# Patient Record
Sex: Female | Born: 1985 | Race: White | Hispanic: No | Marital: Single | Smoking: Never smoker
Health system: Southern US, Community
[De-identification: ages and names within clinical notes are randomized; demographics above are authoritative.]

## PROBLEM LIST (undated history)

## (undated) DIAGNOSIS — T7840XA Allergy, unspecified, initial encounter: Secondary | ICD-10-CM

## (undated) DIAGNOSIS — J45909 Unspecified asthma, uncomplicated: Secondary | ICD-10-CM

## (undated) HISTORY — PX: KNEE SURGERY: SHX244

## (undated) HISTORY — DX: Unspecified asthma, uncomplicated: J45.909

## (undated) HISTORY — PX: TONSILLECTOMY AND ADENOIDECTOMY: SUR1326

## (undated) HISTORY — DX: Allergy, unspecified, initial encounter: T78.40XA

---

## 2012-11-22 ENCOUNTER — Other Ambulatory Visit (HOSPITAL_COMMUNITY)
Admission: RE | Admit: 2012-11-22 | Discharge: 2012-11-22 | Disposition: A | Discharge status: Home / Self Care | Payer: BC Managed Care – PPO | Source: Ambulatory Visit | Attending: Family Medicine | Admitting: Family Medicine

## 2012-11-22 DIAGNOSIS — Z124 Encounter for screening for malignant neoplasm of cervix: Secondary | ICD-10-CM | POA: Insufficient documentation

## 2014-01-22 ENCOUNTER — Other Ambulatory Visit: Payer: Self-pay | Admitting: Physician Assistant

## 2014-01-22 ENCOUNTER — Ambulatory Visit
Admission: RE | Admit: 2014-01-22 | Discharge: 2014-01-22 | Disposition: A | Discharge status: Home / Self Care | Payer: BC Managed Care – PPO | Source: Ambulatory Visit | Attending: Physician Assistant | Admitting: Physician Assistant

## 2014-01-22 DIAGNOSIS — R52 Pain, unspecified: Secondary | ICD-10-CM

## 2014-08-05 ENCOUNTER — Other Ambulatory Visit: Payer: Self-pay | Admitting: Physician Assistant

## 2014-08-05 ENCOUNTER — Ambulatory Visit
Admission: RE | Admit: 2014-08-05 | Discharge: 2014-08-05 | Disposition: A | Discharge status: Home / Self Care | Payer: BLUE CROSS/BLUE SHIELD | Source: Ambulatory Visit | Attending: Physician Assistant | Admitting: Physician Assistant

## 2014-08-05 DIAGNOSIS — M546 Pain in thoracic spine: Secondary | ICD-10-CM

## 2015-06-01 ENCOUNTER — Ambulatory Visit: Payer: BLUE CROSS/BLUE SHIELD | Admitting: Internal Medicine

## 2015-06-04 ENCOUNTER — Encounter: Payer: Self-pay | Admitting: Internal Medicine

## 2015-06-04 ENCOUNTER — Ambulatory Visit (INDEPENDENT_AMBULATORY_CARE_PROVIDER_SITE_OTHER): Payer: BLUE CROSS/BLUE SHIELD | Admitting: Internal Medicine

## 2015-06-04 VITALS — BP 110/70 | HR 105 | Temp 98.1°F | Resp 16 | Ht 65.0 in | Wt 241.0 lb

## 2015-06-04 DIAGNOSIS — J4520 Mild intermittent asthma, uncomplicated: Secondary | ICD-10-CM

## 2015-06-04 DIAGNOSIS — J302 Other seasonal allergic rhinitis: Secondary | ICD-10-CM | POA: Diagnosis not present

## 2015-06-04 MED ORDER — MONTELUKAST SODIUM 10 MG PO TABS: 10.0000 mg | ORAL_TABLET | Freq: Every day | ORAL | Status: DC

## 2015-06-04 NOTE — Patient Instructions (Signed)
We have sent in the singulair (montelukast) which is good for the sinuses especially in allergic asthma sufferers. 1 pill a day during your worst seasons.   If you need any refills or problems please feel free to call us.  If you are doing well come back in about 1 year for a physical.   Health Maintenance, Female Adopting a healthy lifestyle and getting preventive care can go a long way to promote health and wellness. Talk with your health care provider about what schedule of regular examinations is right for you. This is a good chance for you to check in with your provider about disease prevention and staying healthy. In between checkups, there are plenty of things you can do on your own. Experts have done a lot of research about which lifestyle changes and preventive measures are most likely to keep you healthy. Ask your health care provider for more information. WEIGHT AND DIET  Eat a healthy diet  Be sure to include plenty of vegetables, fruits, low-fat dairy products, and lean protein.  Do not eat a lot of foods high in solid fats, added sugars, or salt.  Get regular exercise. This is one of the most important things you can do for your health.  Most adults should exercise for at least 150 minutes each week. The exercise should increase your heart rate and make you sweat (moderate-intensity exercise).  Most adults should also do strengthening exercises at least twice a week. This is in addition to the moderate-intensity exercise.  Maintain a healthy weight  Body mass index (BMI) is a measurement that can be used to identify possible weight problems. It estimates body fat based on height and weight. Your health care provider can help determine your BMI and help you achieve or maintain a healthy weight.  For females 66 years of age and older:   A BMI below 18.5 is considered underweight.  A BMI of 18.5 to 24.9 is normal.  A BMI of 25 to 29.9 is considered overweight.  A BMI of  30 and above is considered obese.  Watch levels of cholesterol and blood lipids  You should start having your blood tested for lipids and cholesterol at 30 years of age, then have this test every 5 years.  You may need to have your cholesterol levels checked more often if:  Your lipid or cholesterol levels are high.  You are older than 30 years of age.  You are at high risk for heart disease.  CANCER SCREENING   Lung Cancer  Lung cancer screening is recommended for adults 28-43 years old who are at high risk for lung cancer because of a history of smoking.  A yearly low-dose CT scan of the lungs is recommended for people who:  Currently smoke.  Have quit within the past 15 years.  Have at least a 30-pack-year history of smoking. A pack year is smoking an average of one pack of cigarettes a day for 1 year.  Yearly screening should continue until it has been 15 years since you quit.  Yearly screening should stop if you develop a health problem that would prevent you from having lung cancer treatment.  Breast Cancer  Practice breast self-awareness. This means understanding how your breasts normally appear and feel.  It also means doing regular breast self-exams. Let your health care provider know about any changes, no matter how small.  If you are in your 20s or 30s, you should have a clinical breast exam (CBE)  by a health care provider every 1-3 years as part of a regular health exam.  If you are 40 or older, have a CBE every year. Also consider having a breast X-ray (mammogram) every year.  If you have a family history of breast cancer, talk to your health care provider about genetic screening.  If you are at high risk for breast cancer, talk to your health care provider about having an MRI and a mammogram every year.  Breast cancer gene (BRCA) assessment is recommended for women who have family members with BRCA-related cancers. BRCA-related cancers  include:  Breast.  Ovarian.  Tubal.  Peritoneal cancers.  Results of the assessment will determine the need for genetic counseling and BRCA1 and BRCA2 testing. Cervical Cancer Your health care provider may recommend that you be screened regularly for cancer of the pelvic organs (ovaries, uterus, and vagina). This screening involves a pelvic examination, including checking for microscopic changes to the surface of your cervix (Pap test). You may be encouraged to have this screening done every 3 years, beginning at age 21.  For women ages 30-65, health care providers may recommend pelvic exams and Pap testing every 3 years, or they may recommend the Pap and pelvic exam, combined with testing for human papilloma virus (HPV), every 5 years. Some types of HPV increase your risk of cervical cancer. Testing for HPV may also be done on women of any age with unclear Pap test results.  Other health care providers may not recommend any screening for nonpregnant women who are considered low risk for pelvic cancer and who do not have symptoms. Ask your health care provider if a screening pelvic exam is right for you.  If you have had past treatment for cervical cancer or a condition that could lead to cancer, you need Pap tests and screening for cancer for at least 20 years after your treatment. If Pap tests have been discontinued, your risk factors (such as having a new sexual partner) need to be reassessed to determine if screening should resume. Some women have medical problems that increase the chance of getting cervical cancer. In these cases, your health care provider may recommend more frequent screening and Pap tests. Colorectal Cancer  This type of cancer can be detected and often prevented.  Routine colorectal cancer screening usually begins at 30 years of age and continues through 30 years of age.  Your health care provider may recommend screening at an earlier age if you have risk factors for  colon cancer.  Your health care provider may also recommend using home test kits to check for hidden blood in the stool.  A small camera at the end of a tube can be used to examine your colon directly (sigmoidoscopy or colonoscopy). This is done to check for the earliest forms of colorectal cancer.  Routine screening usually begins at age 50.  Direct examination of the colon should be repeated every 5-10 years through 30 years of age. However, you may need to be screened more often if early forms of precancerous polyps or small growths are found. Skin Cancer  Check your skin from head to toe regularly.  Tell your health care provider about any new moles or changes in moles, especially if there is a change in a mole's shape or color.  Also tell your health care provider if you have a mole that is larger than the size of a pencil eraser.  Always use sunscreen. Apply sunscreen liberally and repeatedly throughout the   day.  Protect yourself by wearing long sleeves, pants, a wide-brimmed hat, and sunglasses whenever you are outside. HEART DISEASE, DIABETES, AND HIGH BLOOD PRESSURE   High blood pressure causes heart disease and increases the risk of stroke. High blood pressure is more likely to develop in:  People who have blood pressure in the high end of the normal range (130-139/85-89 mm Hg).  People who are overweight or obese.  People who are African American.  If you are 18-39 years of age, have your blood pressure checked every 3-5 years. If you are 40 years of age or older, have your blood pressure checked every year. You should have your blood pressure measured twice--once when you are at a hospital or clinic, and once when you are not at a hospital or clinic. Record the average of the two measurements. To check your blood pressure when you are not at a hospital or clinic, you can use:  An automated blood pressure machine at a pharmacy.  A home blood pressure monitor.  If you  are between 55 years and 79 years old, ask your health care provider if you should take aspirin to prevent strokes.  Have regular diabetes screenings. This involves taking a blood sample to check your fasting blood sugar level.  If you are at a normal weight and have a low risk for diabetes, have this test once every three years after 30 years of age.  If you are overweight and have a high risk for diabetes, consider being tested at a younger age or more often. PREVENTING INFECTION  Hepatitis B  If you have a higher risk for hepatitis B, you should be screened for this virus. You are considered at high risk for hepatitis B if:  You were born in a country where hepatitis B is common. Ask your health care provider which countries are considered high risk.  Your parents were born in a high-risk country, and you have not been immunized against hepatitis B (hepatitis B vaccine).  You have HIV or AIDS.  You use needles to inject street drugs.  You live with someone who has hepatitis B.  You have had sex with someone who has hepatitis B.  You get hemodialysis treatment.  You take certain medicines for conditions, including cancer, organ transplantation, and autoimmune conditions. Hepatitis C  Blood testing is recommended for:  Everyone born from 1945 through 1965.  Anyone with known risk factors for hepatitis C. Sexually transmitted infections (STIs)  You should be screened for sexually transmitted infections (STIs) including gonorrhea and chlamydia if:  You are sexually active and are younger than 30 years of age.  You are older than 30 years of age and your health care provider tells you that you are at risk for this type of infection.  Your sexual activity has changed since you were last screened and you are at an increased risk for chlamydia or gonorrhea. Ask your health care provider if you are at risk.  If you do not have HIV, but are at risk, it may be recommended that you  take a prescription medicine daily to prevent HIV infection. This is called pre-exposure prophylaxis (PrEP). You are considered at risk if:  You are sexually active and do not regularly use condoms or know the HIV status of your partner(s).  You take drugs by injection.  You are sexually active with a partner who has HIV. Talk with your health care provider about whether you are at high risk of   being infected with HIV. If you choose to begin PrEP, you should first be tested for HIV. You should then be tested every 3 months for as long as you are taking PrEP.  PREGNANCY   If you are premenopausal and you may become pregnant, ask your health care provider about preconception counseling.  If you may become pregnant, take 400 to 800 micrograms (mcg) of folic acid every day.  If you want to prevent pregnancy, talk to your health care provider about birth control (contraception). OSTEOPOROSIS AND MENOPAUSE   Osteoporosis is a disease in which the bones lose minerals and strength with aging. This can result in serious bone fractures. Your risk for osteoporosis can be identified using a bone density scan.  If you are 65 years of age or older, or if you are at risk for osteoporosis and fractures, ask your health care provider if you should be screened.  Ask your health care provider whether you should take a calcium or vitamin D supplement to lower your risk for osteoporosis.  Menopause may have certain physical symptoms and risks.  Hormone replacement therapy may reduce some of these symptoms and risks. Talk to your health care provider about whether hormone replacement therapy is right for you.  HOME CARE INSTRUCTIONS   Schedule regular health, dental, and eye exams.  Stay current with your immunizations.   Do not use any tobacco products including cigarettes, chewing tobacco, or electronic cigarettes.  If you are pregnant, do not drink alcohol.  If you are breastfeeding, limit how  much and how often you drink alcohol.  Limit alcohol intake to no more than 1 drink per day for nonpregnant women. One drink equals 12 ounces of beer, 5 ounces of wine, or 1 ounces of hard liquor.  Do not use street drugs.  Do not share needles.  Ask your health care provider for help if you need support or information about quitting drugs.  Tell your health care provider if you often feel depressed.  Tell your health care provider if you have ever been abused or do not feel safe at home.   This information is not intended to replace advice given to you by your health care provider. Make sure you discuss any questions you have with your health care provider.   Document Released: 11/15/2010 Document Revised: 05/23/2014 Document Reviewed: 04/03/2013 Elsevier Interactive Patient Education 2016 [employer]  

## 2015-06-05 ENCOUNTER — Encounter: Payer: Self-pay | Admitting: Internal Medicine

## 2015-06-05 DIAGNOSIS — J45909 Unspecified asthma, uncomplicated: Secondary | ICD-10-CM | POA: Insufficient documentation

## 2015-06-05 DIAGNOSIS — J309 Allergic rhinitis, unspecified: Secondary | ICD-10-CM | POA: Insufficient documentation

## 2015-06-05 NOTE — Assessment & Plan Note (Signed)
She is using flonase daily and will stop the zyrtec and add singulair. Talked to her about sinus rinses as well since she does not like being on medicines.

## 2015-06-05 NOTE — Assessment & Plan Note (Signed)
She has albuterol prn and will add singulair as her allergies mostly trigger her asthma. Does not need step therapy today and no flare. Mild intermittent.

## 2015-06-05 NOTE — Progress Notes (Signed)
   Subjective:    Patient ID: Carrie Ward, female    DOB: 1986/03/13, 30 y.o.   MRN: [id number]  HPI The patient is a new 30 YO female coming in for her asthma. She does have worsening symptoms in the season changes. Usually when she gets a cold this sets off a flare. Denies flare now but using flonase and zyrtec to avoid. She uses albuterol inhaler or nebulizer in flare. Has rarely needed inhaled corticosteroids to avoid prednisone. Last prednisone was about 1 year ago. Denies fevers or chills. Mild cough with some allergy drainage. Does not feel that she is well controlled with that right now.  PMH, [facility], social history reviewed and updated.   Review of Systems  Constitutional: Negative for fever, activity change, appetite change, fatigue and unexpected weight change.  HENT: Positive for congestion, postnasal drip and rhinorrhea. Negative for ear discharge, ear pain, sinus pressure, sore throat and trouble swallowing.   Eyes: Negative.   Respiratory: Negative for cough, chest tightness and shortness of breath.   Cardiovascular: Negative for chest pain, palpitations and leg swelling.  Gastrointestinal: Negative for nausea, abdominal pain, diarrhea, constipation and abdominal distention.  Musculoskeletal: Negative.   Skin: Negative.   Neurological: Negative.   Psychiatric/Behavioral: Negative.       Objective:   Physical Exam  Constitutional: She is oriented to person, place, and time. She appears well-developed and well-nourished.  HENT:  Head: Normocephalic and atraumatic.  Oropharynx red with some clear drainage  Eyes: EOM are normal.  Neck: Normal range of motion.  Cardiovascular: Normal rate and regular rhythm.   No murmur heard. Pulmonary/Chest: Effort normal and breath sounds normal. No respiratory distress. She has no wheezes. She has no rales.  Abdominal: Soft. Bowel sounds are normal. She exhibits no distension. There is no tenderness. There is no rebound.    Musculoskeletal: She exhibits no edema.  Neurological: She is alert and oriented to person, place, and time. Coordination normal.  Skin: Skin is warm and dry.  Psychiatric: She has a normal mood and affect.   Filed Vitals:   06/04/15 1532  BP: 110/70  Pulse: 105  Temp: 98.1 F (36.7 C)  TempSrc: Oral  Resp: 16  Height:  (1.651 m)  Weight: 241 lb (109.317 kg)  SpO2: 98%      Assessment & Plan:

## 2015-06-05 NOTE — Assessment & Plan Note (Signed)
She does exercise 3-4 times per week. Meeting with nutritionist to work on eating habits. Per her reports recent labs normal for sugar and cholesterol and will get those records.

## 2016-01-29 ENCOUNTER — Ambulatory Visit (INDEPENDENT_AMBULATORY_CARE_PROVIDER_SITE_OTHER): Payer: BLUE CROSS/BLUE SHIELD | Admitting: Family

## 2016-01-29 ENCOUNTER — Encounter: Payer: Self-pay | Admitting: Family

## 2016-01-29 ENCOUNTER — Telehealth: Payer: Self-pay | Admitting: Internal Medicine

## 2016-01-29 DIAGNOSIS — J019 Acute sinusitis, unspecified: Secondary | ICD-10-CM | POA: Insufficient documentation

## 2016-01-29 DIAGNOSIS — J0140 Acute pansinusitis, unspecified: Secondary | ICD-10-CM | POA: Diagnosis not present

## 2016-01-29 MED ORDER — ALBUTEROL SULFATE (5 MG/ML) 0.5% IN NEBU: 2.5000 mg | INHALATION_SOLUTION | Freq: Four times a day (QID) | RESPIRATORY_TRACT | 1 refills | Status: DC | PRN

## 2016-01-29 MED ORDER — FLUTICASONE FUROATE-VILANTEROL 100-25 MCG/INH IN AEPB
1.0000 | INHALATION_SPRAY | Freq: Every day | RESPIRATORY_TRACT | 0 refills | Status: AC
Start: 2016-01-29 — End: ?

## 2016-01-29 MED ORDER — ALBUTEROL SULFATE HFA 108 (90 BASE) MCG/ACT IN AERS: 1.0000 | INHALATION_SPRAY | RESPIRATORY_TRACT | 3 refills | Status: AC | PRN

## 2016-01-29 MED ORDER — ALBUTEROL SULFATE (2.5 MG/3ML) 0.083% IN NEBU: 2.5000 mg | INHALATION_SOLUTION | Freq: Four times a day (QID) | RESPIRATORY_TRACT | 1 refills | Status: AC | PRN

## 2016-01-29 MED ORDER — HYDROCOD POLST-CPM POLST ER 10-8 MG/5ML PO SUER: 5.0000 mL | Freq: Every evening | ORAL | 0 refills | Status: DC | PRN

## 2016-01-29 MED ORDER — AMOXICILLIN-POT CLAVULANATE 875-125 MG PO TABS: 1.0000 | ORAL_TABLET | Freq: Two times a day (BID) | ORAL | 0 refills | Status: DC

## 2016-01-29 NOTE — Telephone Encounter (Signed)
Pt called in and said that the form that she got the albuterol (PROVENTIL) (5 MG/ML) 0.5% nebulizer solution [[id number] today was not normally how she gets it.  She normally just gets it in the tubes not a dropper.  Can this be changed and faxed to [facility] [address] .

## 2016-01-29 NOTE — Progress Notes (Signed)
Subjective:    Patient ID: Carrie Ward, female    DOB: 1986/01/06, 30 y.o.   MRN: [id number]  Chief Complaint  Patient presents with  . Cough    x2 weeks, started with runny nose, labor day weekend lost voice, congestion in chest, pressure in ears and head, has had to use nebulizer at home more often    HPI:  Carrie Ward is a 30 y.o. female who  has a past medical history of Allergy and Asthma. and presents today for an acute office visit.   This is a new problem. Associated symptom of runny nose, chest congestion, pressure in ears and head have been going on for about 2 weeks. Has had to use the nebulizer at home more often. No fevers that she can recall. Modifying factors include Mucinex and Sudafed. Course of the symptoms has had some improvement and then worsening. No recent antibiotic use. Generally does not have to use her inhalers.   No Known Allergies    Outpatient Medications Prior to Visit  Medication Sig Dispense Refill  . fluticasone (FLONASE) 50 MCG/ACT nasal spray Place 2 sprays into both nostrils daily.    Marland Kitchen. albuterol (PROVENTIL) (5 MG/ML) 0.5% nebulizer solution Take 2.5 mg by nebulization every 6 (six) hours as needed for wheezing or shortness of breath.    Marland Kitchen. albuterol (VENTOLIN HFA) 108 (90 Base) MCG/ACT inhaler Inhale 1 puff into the lungs as needed for wheezing or shortness of breath.    . cetirizine (ZYRTEC) 10 MG tablet Take 10 mg by mouth daily.    . montelukast (SINGULAIR) 10 MG tablet Take 1 tablet (10 mg total) by mouth at bedtime. 90 tablet 3   No facility-administered medications prior to visit.     Review of Systems  Constitutional: Negative for chills and fever.  HENT: Positive for congestion and sinus pressure. Negative for sore throat.   Respiratory: Positive for cough, shortness of breath and wheezing. Negative for chest tightness.   Neurological: Positive for headaches.      Objective:    BP 118/72 (BP Location: Left Arm,  Patient Position: Sitting, Cuff Size: Large)   Pulse 88   Temp 98.3 F (36.8 C) (Oral)   Resp 16   Ht 5\' 5"  (1.651 m)   Wt 250 lb (113.4 kg)   SpO2 98%   BMI 41.60 kg/m  Nursing note and vital signs reviewed.  Physical Exam  Constitutional: She is oriented to person, place, and time. She appears well-developed and well-nourished. No distress.  HENT:  Right Ear: Hearing, tympanic membrane, external ear and ear canal normal.  Left Ear: Hearing, tympanic membrane, external ear and ear canal normal.  Nose: Right sinus exhibits maxillary sinus tenderness and frontal sinus tenderness. Left sinus exhibits maxillary sinus tenderness and frontal sinus tenderness.  Mouth/Throat: Uvula is midline, oropharynx is clear and moist and mucous membranes are normal.  Neck: Neck supple.  Cardiovascular: Normal rate, regular rhythm, normal heart sounds and intact distal pulses.   Pulmonary/Chest: Effort normal and breath sounds normal.  Neurological: She is alert and oriented to person, place, and time.  Skin: Skin is warm and dry.  Psychiatric: She has a normal mood and affect. Her behavior is normal. Judgment and thought content normal.       Assessment & Plan:   Problem List Items Addressed This Visit      Respiratory   Sinusitis, acute    Symptoms and exam consistent with bacterial sinusitis. Start Augmentin. Start Tussionex as  needed for cough and sleep. Sample of Breo provided for wheezing. Continue over-the-counter medications as needed for symptom relief and supportive care. Follow-up if symptoms worsen or do not improve.      Relevant Medications   amoxicillin-clavulanate (AUGMENTIN) 875-125 MG tablet   chlorpheniramine-HYDROcodone (TUSSIONEX PENNKINETIC ER) 10-8 MG/5ML SUER    Other Visit Diagnoses   None.      I have discontinued Ms. Gnau's cetirizine and montelukast. I am also having her start on amoxicillin-clavulanate, chlorpheniramine-HYDROcodone, and fluticasone  furoate-vilanterol. Additionally, I am having her maintain her fluticasone and albuterol.   Meds ordered this encounter  Medications  . DISCONTD: albuterol (PROVENTIL) (5 MG/ML) 0.5% nebulizer solution    Sig: Take 0.5 mLs (2.5 mg total) by nebulization every 6 (six) hours as needed for wheezing or shortness of breath.    Dispense:  20 mL    Refill:  1    Order Specific Question:   Supervising Provider    Answer:   Hillard Danker A [[id number]]  . albuterol (VENTOLIN HFA) 108 (90 Base) MCG/ACT inhaler    Sig: Inhale 1 puff into the lungs as needed for wheezing or shortness of breath.    Dispense:  18 g    Refill:  3    Order Specific Question:   Supervising Provider    Answer:   Hillard Danker A [[id number]]  . amoxicillin-clavulanate (AUGMENTIN) 875-125 MG tablet    Sig: Take 1 tablet by mouth 2 (two) times daily.    Dispense:  20 tablet    Refill:  0    Order Specific Question:   Supervising Provider    Answer:   Hillard Danker A [[id number]]  . chlorpheniramine-HYDROcodone (TUSSIONEX PENNKINETIC ER) 10-8 MG/5ML SUER    Sig: Take 5 mLs by mouth at bedtime as needed.    Dispense:  115 mL    Refill:  0    Order Specific Question:   Supervising Provider    Answer:   Hillard Danker A [[id number]]  . fluticasone furoate-vilanterol (BREO ELLIPTA) 100-25 MCG/INH AEPB    Sig: Inhale 1 puff into the lungs daily.    Dispense:  28 each    Refill:  0    Order Specific Question:   Supervising Provider    Answer:   Hillard Danker A [[id number]]     Follow-up: Return if symptoms worsen or fail to improve.  Jeanine Luz, FNP

## 2016-01-29 NOTE — Patient Instructions (Signed)
Thank you for choosing [facility].  SUMMARY AND INSTRUCTIONS:  Medication:  Your prescription(s) have been submitted to your pharmacy or been printed and provided for you. Please take as directed and contact our office if you believe you are having problem(s) with the medication(s) or have any questions.   Follow up:  If your symptoms worsen or fail to improve, please contact our office for further instruction, or in case of emergency go directly to the emergency room at the closest medical facility.    General Recommendations:    Please drink plenty of fluids.  Get plenty of rest   Sleep in humidified air  Use saline nasal sprays  Netti pot   OTC Medications:  Decongestants - helps relieve congestion   Flonase (generic fluticasone) or Nasacort (generic triamcinolone) - please make sure to use the "cross-over" technique at a 45 degree angle towards the opposite eye as opposed to straight up the nasal passageway.   Sudafed (generic pseudoephedrine - Note this is the one that is available behind the pharmacy counter); Products with phenylephrine (-PE) may also be used but is often not as effective as pseudoephedrine.   If you have HIGH BLOOD PRESSURE - Coricidin HBP; AVOID any product that is -D as this contains pseudoephedrine which may increase your blood pressure.  Afrin (oxymetazoline) every 6-8 hours for up to 3 days.   Allergies - helps relieve runny nose, itchy eyes and sneezing   Claritin (generic loratidine), Allegra (fexofenidine), or Zyrtec (generic cyrterizine) for runny nose. These medications should not cause drowsiness.  Note - Benadryl (generic diphenhydramine) may be used however may cause drowsiness  Cough -   Delsym or Robitussin (generic dextromethorphan)  Expectorants - helps loosen mucus to ease removal   Mucinex (generic guaifenesin) as directed on the package.  Headaches / General Aches   Tylenol (generic acetaminophen) - DO  NOT EXCEED 3 grams (3,000 mg) in a 24 hour time period  Advil/Motrin (generic ibuprofen)   Sore Throat -   Salt water gargle   Chloraseptic (generic benzocaine) spray or lozenges / Sucrets (generic dyclonine)    Sinusitis Sinusitis is redness, soreness, and inflammation of the paranasal sinuses. Paranasal sinuses are air pockets within the bones of your face (beneath the eyes, the middle of the forehead, or above the eyes). In healthy paranasal sinuses, mucus is able to drain out, and air is able to circulate through them by way of your nose. However, when your paranasal sinuses are inflamed, mucus and air can become trapped. This can allow bacteria and other germs to grow and cause infection. Sinusitis can develop quickly and last only a short time (acute) or continue over a long period (chronic). Sinusitis that lasts for more than 12 weeks is considered chronic.  CAUSES  Causes of sinusitis include:  Allergies.  Structural abnormalities, such as displacement of the cartilage that separates your nostrils (deviated septum), which can decrease the air flow through your nose and sinuses and affect sinus drainage.  Functional abnormalities, such as when the small hairs (cilia) that line your sinuses and help remove mucus do not work properly or are not present. SIGNS AND SYMPTOMS  Symptoms of acute and chronic sinusitis are the same. The primary symptoms are pain and pressure around the affected sinuses. Other symptoms include:  Upper toothache.  Earache.  Headache.  Bad breath.  Decreased sense of smell and taste.  A cough, which worsens when you are lying flat.  Fatigue.  Fever.  Thick drainage   from your nose, which often is green and may contain pus (purulent).  Swelling and warmth over the affected sinuses. DIAGNOSIS  Your health care provider will perform a physical exam. During the exam, your health care provider may:  Look in your nose for signs of abnormal growths  in your nostrils (nasal polyps).  Tap over the affected sinus to check for signs of infection.  View the inside of your sinuses (endoscopy) using an imaging device that has a light attached (endoscope). If your health care provider suspects that you have chronic sinusitis, one or more of the following tests may be recommended:  Allergy tests.  Nasal culture. A sample of mucus is taken from your nose, sent to a lab, and screened for bacteria.  Nasal cytology. A sample of mucus is taken from your nose and examined by your health care provider to determine if your sinusitis is related to an allergy. TREATMENT  Most cases of acute sinusitis are related to a viral infection and will resolve on their own within 10 days. Sometimes medicines are prescribed to help relieve symptoms (pain medicine, decongestants, nasal steroid sprays, or saline sprays).  However, for sinusitis related to a bacterial infection, your health care provider will prescribe antibiotic medicines. These are medicines that will help kill the bacteria causing the infection.  Rarely, sinusitis is caused by a fungal infection. In theses cases, your health care provider will prescribe antifungal medicine. For some cases of chronic sinusitis, surgery is needed. Generally, these are cases in which sinusitis recurs more than 3 times per year, despite other treatments. HOME CARE INSTRUCTIONS   Drink plenty of water. Water helps thin the mucus so your sinuses can drain more easily.  Use a humidifier.  Inhale steam 3 to 4 times a day (for example, sit in the bathroom with the shower running).  Apply a warm, moist washcloth to your face 3 to 4 times a day, or as directed by your health care provider.  Use saline nasal sprays to help moisten and clean your sinuses.  Take medicines only as directed by your health care provider.  If you were prescribed either an antibiotic or antifungal medicine, finish it all even if you start to feel  better. SEEK IMMEDIATE MEDICAL CARE IF:  You have increasing pain or severe headaches.  You have nausea, vomiting, or drowsiness.  You have swelling around your face.  You have vision problems.  You have a stiff neck.  You have difficulty breathing. MAKE SURE YOU:   Understand these instructions.  Will watch your condition.  Will get help right away if you are not doing well or get worse. Document Released: 05/02/2005 Document Revised: 09/16/2013 Document Reviewed: 05/17/2011 Exit[employer] Patient Information 2015 [employer]. This information is not intended to replace advice given to you by your health care provider. Make sure you discuss any questions you have with your health care provider.   

## 2016-01-29 NOTE — Assessment & Plan Note (Signed)
Symptoms and exam consistent with bacterial sinusitis. Start Augmentin. Start Tussionex as needed for cough and sleep. Sample of Breo provided for wheezing. Continue over-the-counter medications as needed for symptom relief and supportive care. Follow-up if symptoms worsen or do not improve.

## 2016-01-29 NOTE — Telephone Encounter (Signed)
Resent

## 2016-10-17 ENCOUNTER — Ambulatory Visit (INDEPENDENT_AMBULATORY_CARE_PROVIDER_SITE_OTHER): Payer: BLUE CROSS/BLUE SHIELD | Admitting: Internal Medicine

## 2016-10-17 ENCOUNTER — Encounter: Payer: Self-pay | Admitting: Internal Medicine

## 2016-10-17 DIAGNOSIS — H66001 Acute suppurative otitis media without spontaneous rupture of ear drum, right ear: Secondary | ICD-10-CM | POA: Diagnosis not present

## 2016-10-17 DIAGNOSIS — J069 Acute upper respiratory infection, unspecified: Secondary | ICD-10-CM | POA: Insufficient documentation

## 2016-10-17 DIAGNOSIS — J040 Acute laryngitis: Secondary | ICD-10-CM

## 2016-10-17 DIAGNOSIS — H6690 Otitis media, unspecified, unspecified ear: Secondary | ICD-10-CM | POA: Insufficient documentation

## 2016-10-17 MED ORDER — AMOXICILLIN-POT CLAVULANATE 875-125 MG PO TABS: 1.0000 | ORAL_TABLET | Freq: Two times a day (BID) | ORAL | 0 refills | Status: AC

## 2016-10-17 NOTE — Progress Notes (Signed)
Subjective:  Patient ID: Carrie Ward, female    DOB: 08-Jun-1985  Age: 31 y.o. MRN: [id number]  CC: No chief complaint on file.   HPI Carrie Ward presents for URI sx's x 1 week - worse. C/o voice loss. Asthma is ok. Coughing up green. C/o bad earache  Outpatient Medications Prior to Visit  Medication Sig Dispense Refill  . albuterol (PROVENTIL) (2.5 MG/3ML) 0.083% nebulizer solution Take 3 mLs (2.5 mg total) by nebulization every 6 (six) hours as needed for wheezing or shortness of breath. 150 mL 1  . albuterol (VENTOLIN HFA) 108 (90 Base) MCG/ACT inhaler Inhale 1 puff into the lungs as needed for wheezing or shortness of breath. 18 g 3  . fluticasone (FLONASE) 50 MCG/ACT nasal spray Place 2 sprays into both nostrils daily.    . fluticasone furoate-vilanterol (BREO ELLIPTA) 100-25 MCG/INH AEPB Inhale 1 puff into the lungs daily. 28 each 0  . amoxicillin-clavulanate (AUGMENTIN) 875-125 MG tablet Take 1 tablet by mouth 2 (two) times daily. 20 tablet 0  . chlorpheniramine-HYDROcodone (TUSSIONEX PENNKINETIC ER) 10-8 MG/5ML SUER Take 5 mLs by mouth at bedtime as needed. 115 mL 0   No facility-administered medications prior to visit.     ROS Review of Systems  Constitutional: Positive for fatigue. Negative for activity change, appetite change, chills and unexpected weight change.  HENT: Positive for congestion, ear pain, rhinorrhea, sore throat and voice change. Negative for mouth sores and sinus pressure.   Eyes: Negative for visual disturbance.  Respiratory: Negative for cough and chest tightness.   Gastrointestinal: Negative for abdominal pain and nausea.  Genitourinary: Negative for difficulty urinating, frequency and vaginal pain.  Musculoskeletal: Negative for back pain and gait problem.  Skin: Negative for pallor and rash.  Neurological: Negative for dizziness, tremors, weakness, numbness and headaches.  Psychiatric/Behavioral: Negative for confusion and sleep  disturbance.    Objective:  BP 126/88 (BP Location: Left Arm, Patient Position: Sitting, Cuff Size: Large)   Pulse 75   Temp 97.8 F (36.6 C) (Oral)   Ht 5\' 5"  (1.651 m)   Wt 269 lb (122 kg)   SpO2 99%   BMI 44.76 kg/m   BP Readings from Last 3 Encounters:  10/17/16 126/88  01/29/16 118/72  06/04/15 110/70    Wt Readings from Last 3 Encounters:  10/17/16 269 lb (122 kg)  01/29/16 250 lb (113.4 kg)  06/04/15 241 lb (109.3 kg)    Physical Exam  Constitutional: She appears well-developed. No distress.  HENT:  Head: Normocephalic.  Right Ear: External ear normal.  Left Ear: External ear normal.  Nose: Nose normal.  Mouth/Throat: Oropharynx is clear and moist.  Eyes: Conjunctivae are normal. Pupils are equal, round, and reactive to light. Right eye exhibits no discharge. Left eye exhibits no discharge.  Neck: Normal range of motion. Neck supple. No JVD present. No tracheal deviation present. No thyromegaly present.  Cardiovascular: Normal rate, regular rhythm and normal heart sounds.   Pulmonary/Chest: No stridor. No respiratory distress. She has no wheezes.  Abdominal: Soft. Bowel sounds are normal. She exhibits no distension and no mass. There is no tenderness. There is no rebound and no guarding.  Musculoskeletal: She exhibits no edema or tenderness.  Lymphadenopathy:    She has no cervical adenopathy.  Neurological: She displays normal reflexes. No cranial nerve deficit. She exhibits normal muscle tone. Coordination normal.  Skin: No rash noted. No erythema.  Psychiatric: She has a normal mood and affect. Her behavior is normal. Judgment and  thought content normal.  hoarse R TM red eryth throat  No results found for: WBC, HGB, HCT, PLT, GLUCOSE, CHOL, TRIG, HDL, LDLDIRECT, LDLCALC, ALT, AST, NA, K, CL, CREATININE, BUN, CO2, TSH, PSA, INR, GLUF, HGBA1C, MICROALBUR  Dg Thoracic Spine W/swimmers  Result Date: 08/05/2014 CLINICAL DATA:  Back pain for 2 weeks EXAM:  THORACIC SPINE - 2 VIEW + SWIMMERS COMPARISON:  None. FINDINGS: The thoracic vertebrae are in normal alignment. Intervertebral disc spaces appear normal. No prominent paravertebral soft tissue is seen. No compression deformity is noted. IMPRESSION: Negative. Electronically Signed   By: Dwyane DeePaul  Barry M.D.   On: 08/05/2014 16:39    Assessment & Plan:   There are no diagnoses linked to this encounter. I have discontinued Ms. Depaulo's amoxicillin-clavulanate and chlorpheniramine-HYDROcodone. I am also having her maintain her fluticasone, albuterol, albuterol, and fluticasone furoate-vilanterol.  No orders of the defined types were placed in this encounter.    Follow-up: No Follow-up on file.  Sonda PrimesAlex Justun Anaya, MD

## 2016-10-17 NOTE — Patient Instructions (Signed)
You can use over-the-counter  "cold" medicines  such as "Tylenol cold" , "Advil cold",  "Mucinex" or" Mucinex D"  for cough and congestion.   Avoid decongestants if you have high blood pressure and use "Afrin" nasal spray for nasal congestion as directed. Use " Delsym" or" Robitussin" cough syrup varietis for cough.  You can use plain "Tylenol" or "Advil" for fever, chills and achyness. Use Halls or Ricola cough drops.  Voice rest   Please, make an appointment if you are not better or if you're worse.  

## 2016-10-17 NOTE — Assessment & Plan Note (Signed)
Augmentin

## 2016-10-17 NOTE — Assessment & Plan Note (Signed)
Voice rest 

## 2017-01-30 IMAGING — CR DG THORACIC SPINE 3V
3 series · 3 of 3 positions shown · non-contrast
Comparison: None.

CLINICAL DATA: Back pain for 2 weeks

EXAM:
THORACIC SPINE - 2 VIEW + SWIMMERS

[view not recorded (1 of 3)]
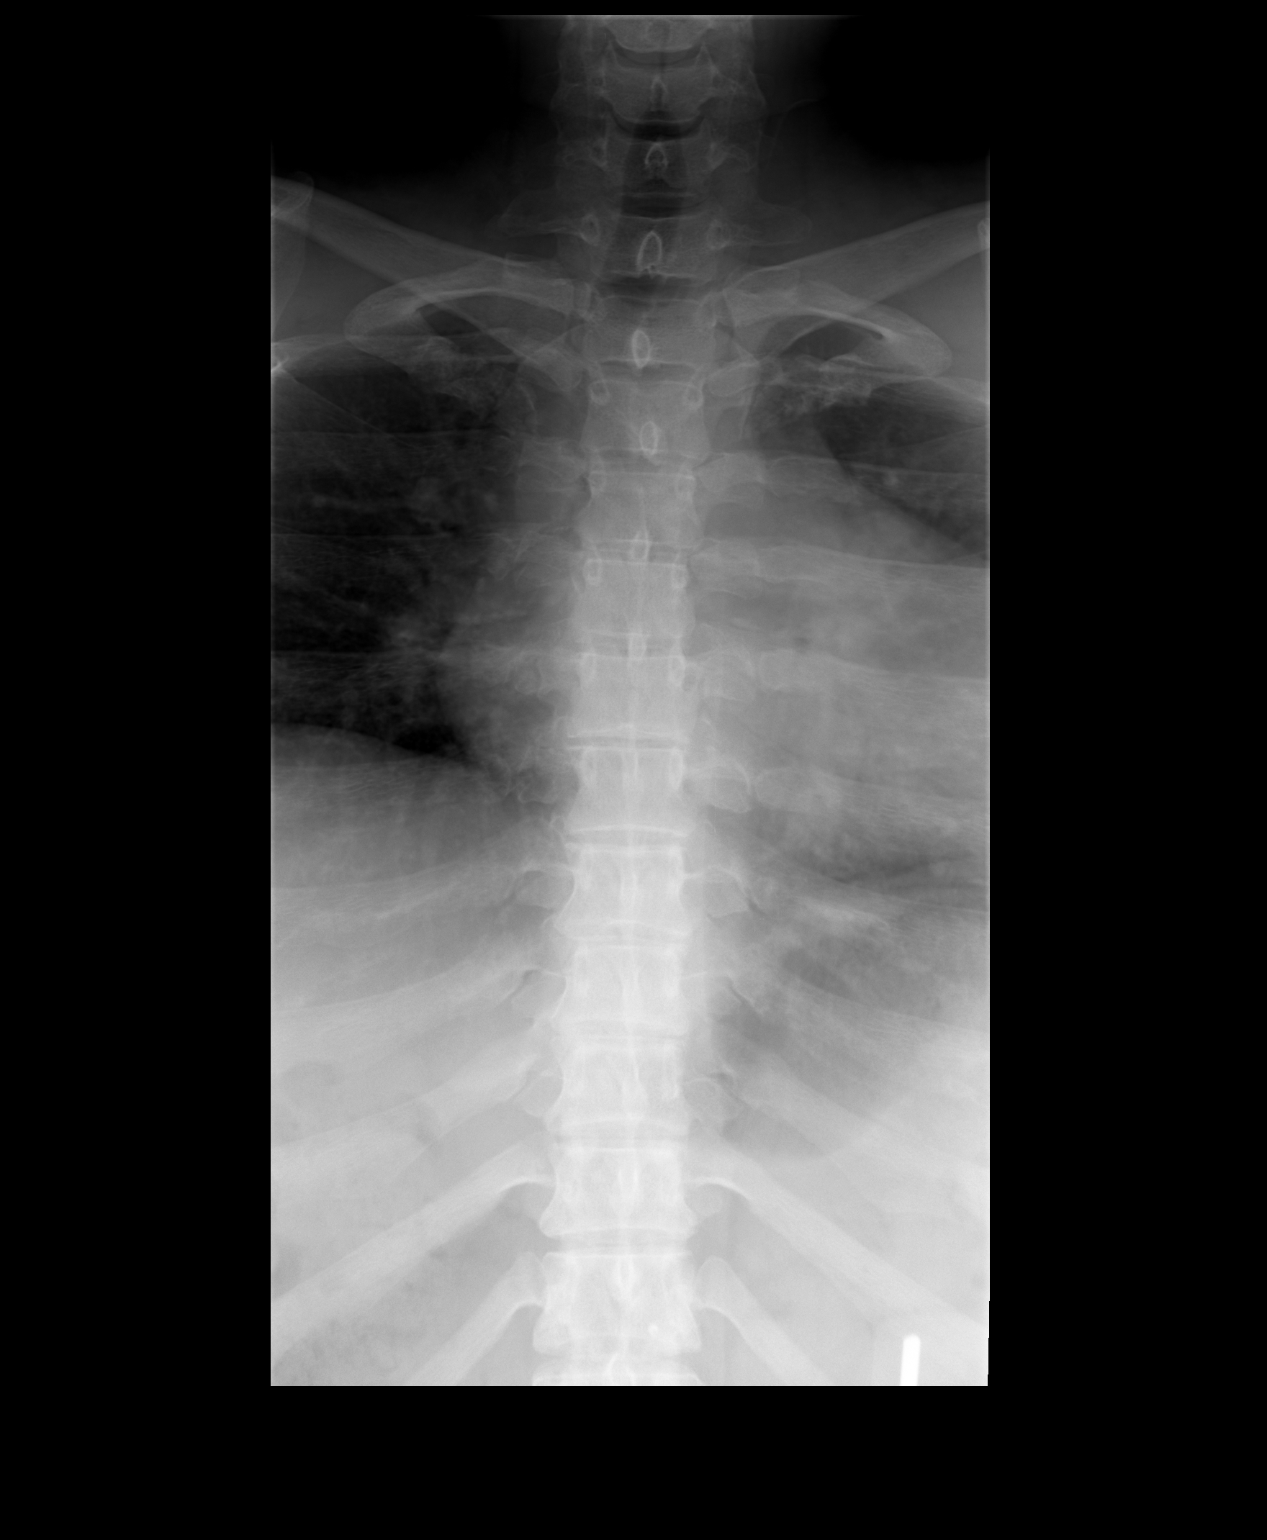

[view not recorded (2 of 3)]
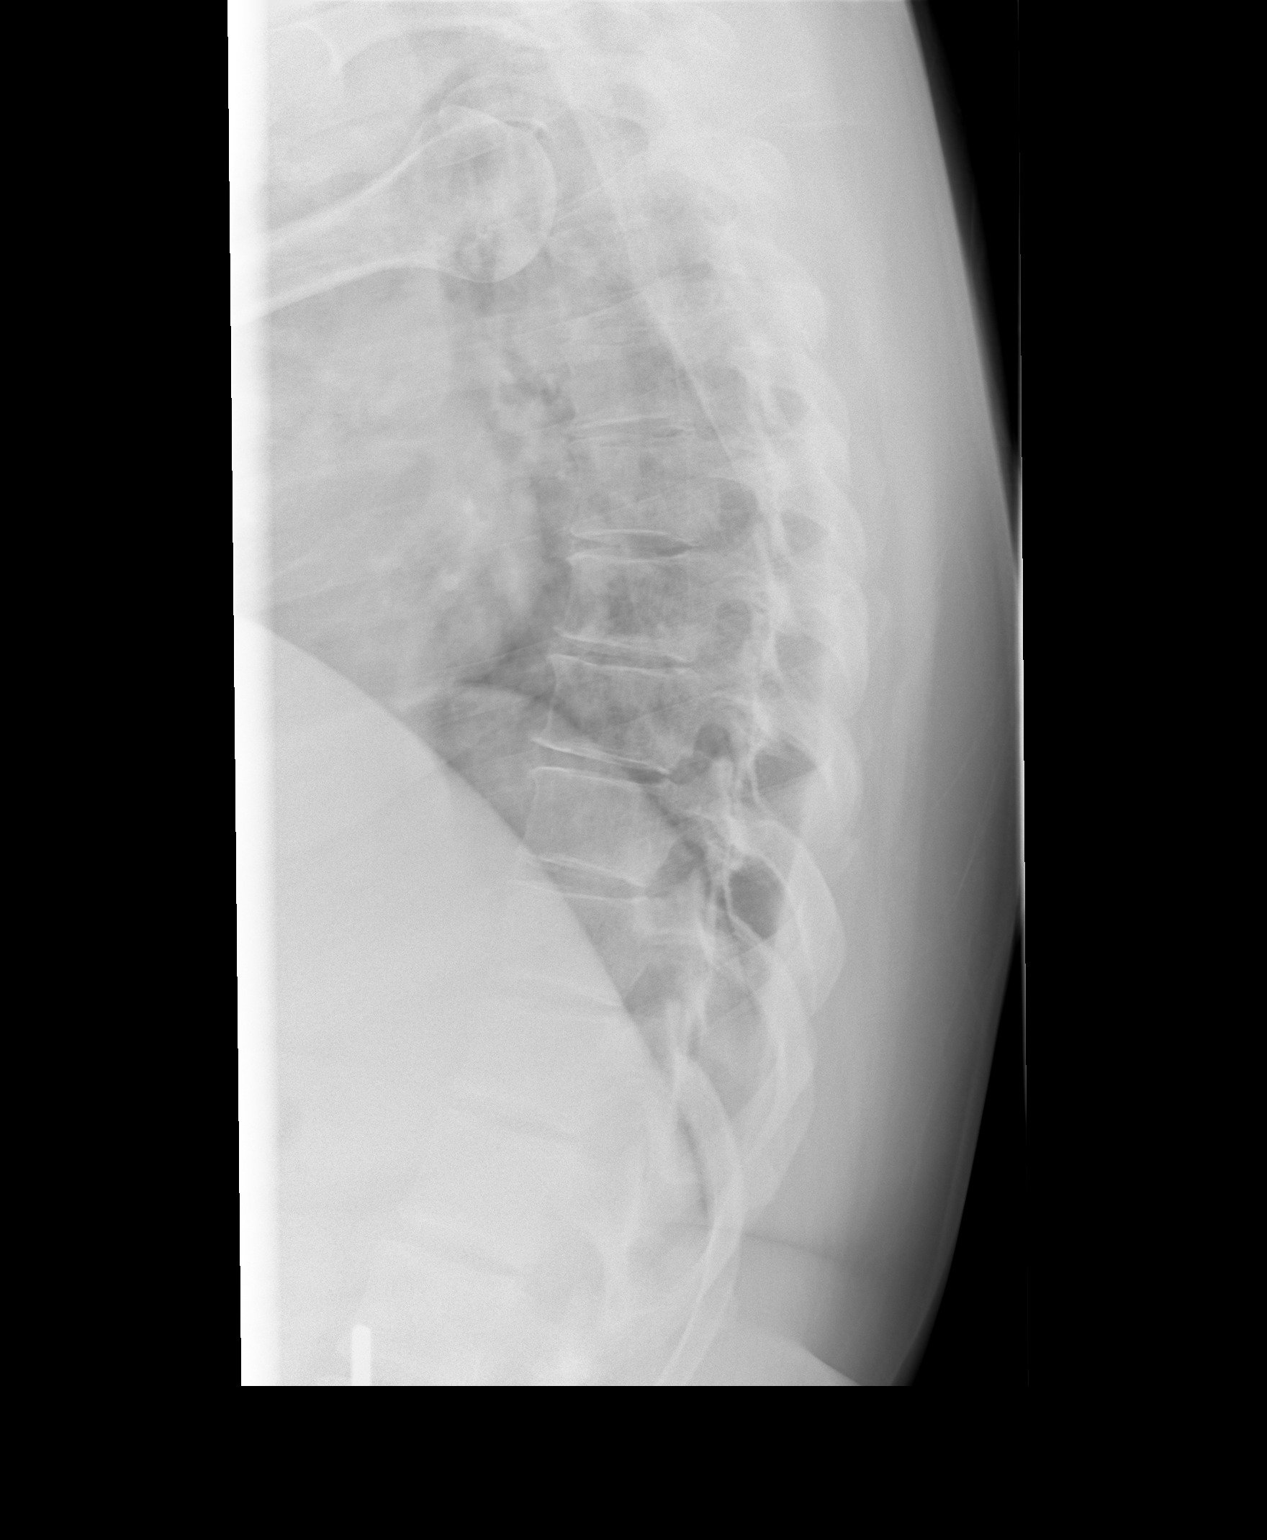

[view not recorded (3 of 3)]
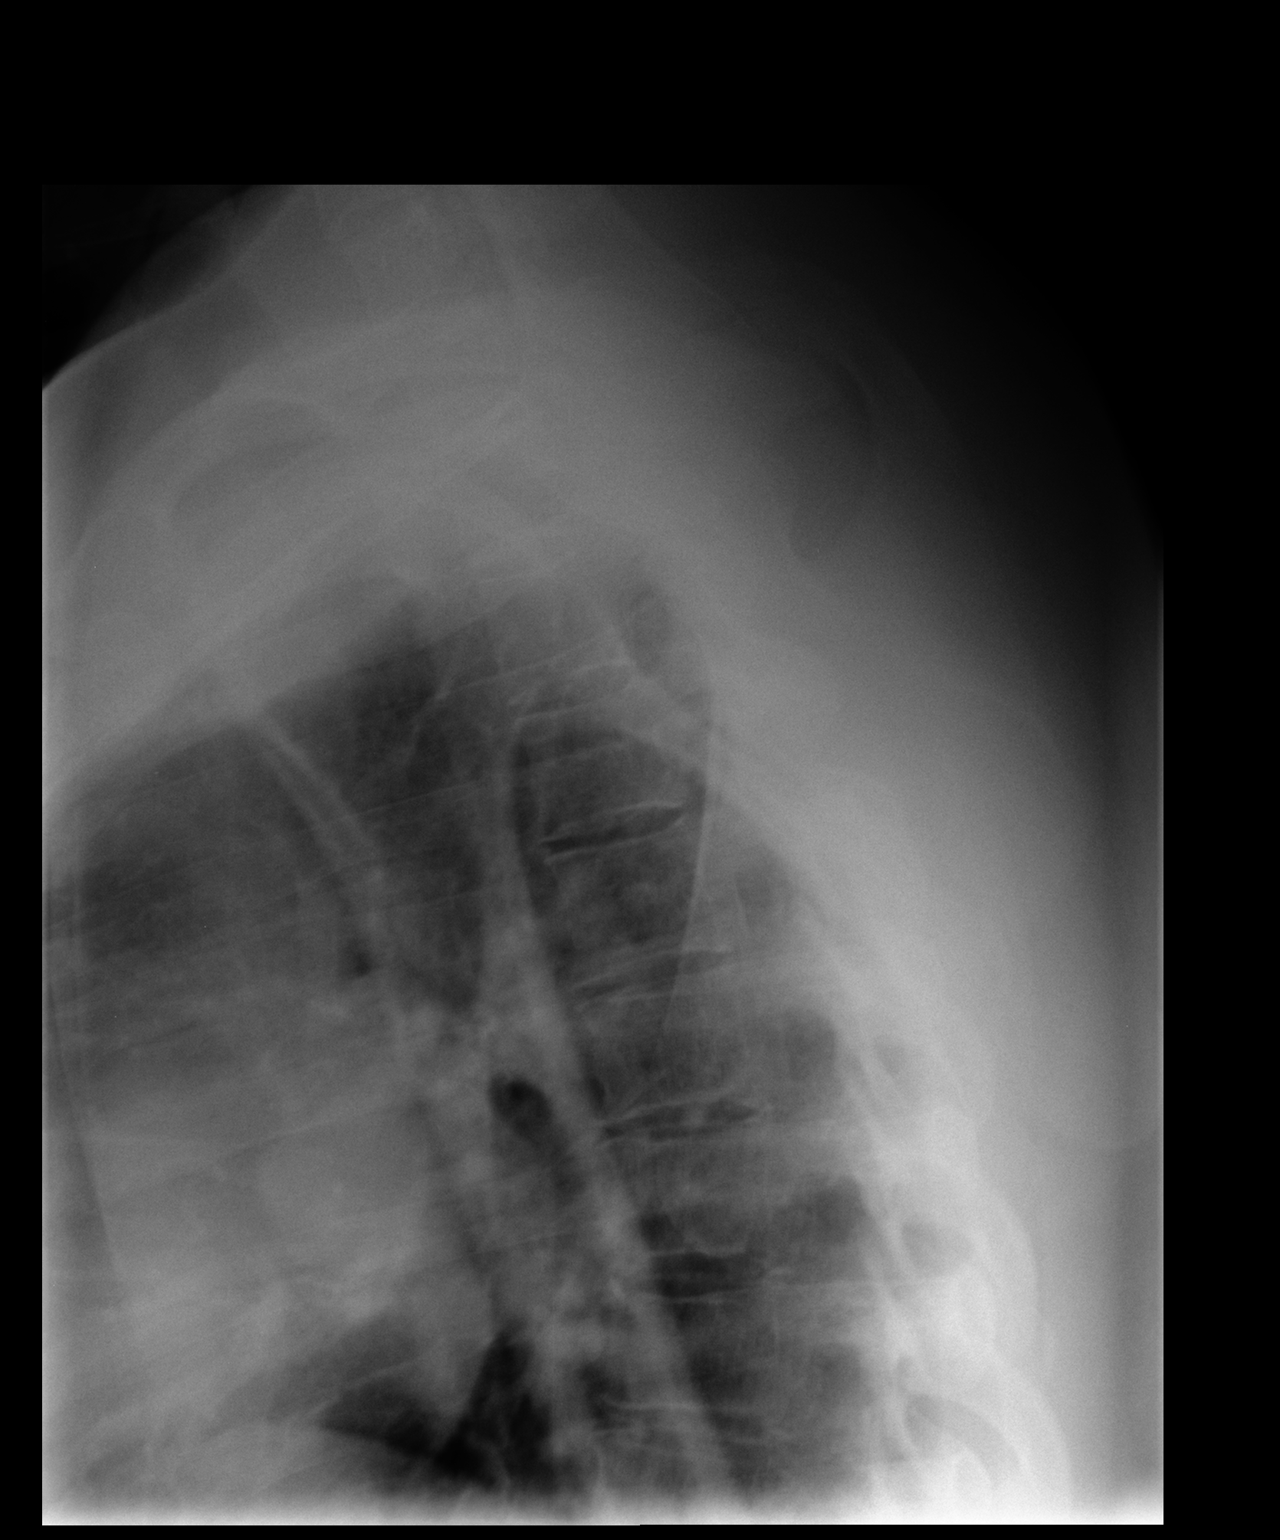

[3 of 3 positions shown; findings below may reference images not displayed]

FINDINGS: The thoracic vertebrae are in normal alignment. Intervertebral disc
spaces appear normal. No prominent paravertebral soft tissue is
seen. No compression deformity is noted.
IMPRESSION: Negative.
